# Patient Record
Sex: Female | Born: 2005 | Race: Black or African American | Hispanic: No | Marital: Single | Smoking: Never smoker
Health system: Southern US, Community
[De-identification: ages and names within clinical notes are randomized; demographics above are authoritative.]

## PROBLEM LIST (undated history)

## (undated) DIAGNOSIS — F32A Depression, unspecified: Secondary | ICD-10-CM

## (undated) DIAGNOSIS — G43909 Migraine, unspecified, not intractable, without status migrainosus: Secondary | ICD-10-CM

## (undated) DIAGNOSIS — J45909 Unspecified asthma, uncomplicated: Secondary | ICD-10-CM

## (undated) HISTORY — DX: Depression, unspecified: F32.A

## (undated) HISTORY — DX: Migraine, unspecified, not intractable, without status migrainosus: G43.909

---
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
## 2024-01-10 ENCOUNTER — Emergency Department (HOSPITAL_BASED_OUTPATIENT_CLINIC_OR_DEPARTMENT_OTHER)
Admission: EM | Admit: 2024-01-10 | Discharge: 2024-01-10 | Disposition: A | Discharge status: Home / Self Care | Payer: MEDICAID | Attending: Emergency Medicine | Admitting: Emergency Medicine

## 2024-01-10 ENCOUNTER — Emergency Department (HOSPITAL_BASED_OUTPATIENT_CLINIC_OR_DEPARTMENT_OTHER): Payer: MEDICAID

## 2024-01-10 ENCOUNTER — Other Ambulatory Visit: Payer: Self-pay

## 2024-01-10 DIAGNOSIS — Y92410 Unspecified street and highway as the place of occurrence of the external cause: Secondary | ICD-10-CM | POA: Insufficient documentation

## 2024-01-10 DIAGNOSIS — M545 Low back pain, unspecified: Secondary | ICD-10-CM | POA: Insufficient documentation

## 2024-01-10 DIAGNOSIS — R519 Headache, unspecified: Secondary | ICD-10-CM | POA: Diagnosis present

## 2024-01-10 DIAGNOSIS — G44319 Acute post-traumatic headache, not intractable: Secondary | ICD-10-CM

## 2024-01-10 DIAGNOSIS — S0083XA Contusion of other part of head, initial encounter: Secondary | ICD-10-CM | POA: Diagnosis not present

## 2024-01-10 LAB — PREGNANCY, URINE: Preg Test, Ur: NEGATIVE

## 2024-01-10 MED ORDER — ACETAMINOPHEN 500 MG PO TABS
1000.0000 mg | ORAL_TABLET | Freq: Once | ORAL | Status: AC
  Administered 2024-01-10: 1000 mg via ORAL
  Filled 2024-01-10: qty 2

## 2024-01-10 MED ORDER — KETOROLAC TROMETHAMINE 60 MG/2ML IM SOLN
30.0000 mg | Freq: Once | INTRAMUSCULAR | Status: AC
  Administered 2024-01-10: 30 mg via INTRAMUSCULAR
  Filled 2024-01-10: qty 2

## 2024-01-10 NOTE — Discharge Instructions (Signed)
 Recommend Tylenol and ibuprofen for pain control.  Your CT imaging was overall reassuring as was your physical exam.
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
## 2024-01-12 ENCOUNTER — Encounter: Payer: Self-pay | Admitting: Family Medicine

## 2024-01-12 ENCOUNTER — Ambulatory Visit (INDEPENDENT_AMBULATORY_CARE_PROVIDER_SITE_OTHER): Payer: MEDICAID | Admitting: Family Medicine

## 2024-01-12 VITALS — BP 98/68 | HR 107 | Temp 98.0°F | Ht 64.0 in | Wt 147.8 lb

## 2024-01-12 DIAGNOSIS — M546 Pain in thoracic spine: Secondary | ICD-10-CM | POA: Diagnosis not present

## 2024-01-12 DIAGNOSIS — F419 Anxiety disorder, unspecified: Secondary | ICD-10-CM | POA: Diagnosis not present

## 2024-01-12 DIAGNOSIS — F4310 Post-traumatic stress disorder, unspecified: Secondary | ICD-10-CM

## 2024-01-12 DIAGNOSIS — F321 Major depressive disorder, single episode, moderate: Secondary | ICD-10-CM

## 2024-01-12 DIAGNOSIS — S060X1D Concussion with loss of consciousness of 30 minutes or less, subsequent encounter: Secondary | ICD-10-CM

## 2024-01-12 MED ORDER — CYCLOBENZAPRINE HCL 5 MG PO TABS: 5.0000 mg | ORAL_TABLET | Freq: Every evening | ORAL | 0 refills | Status: DC | PRN

## 2024-01-12 MED ORDER — MELOXICAM 7.5 MG PO TABS: 7.5000 mg | ORAL_TABLET | Freq: Every day | ORAL | 0 refills | Status: DC

## 2024-01-12 MED ORDER — NORTRIPTYLINE HCL 10 MG PO CAPS: 10.0000 mg | ORAL_CAPSULE | Freq: Every day | ORAL | 1 refills | Status: DC

## 2024-01-12 NOTE — Patient Instructions (Addendum)
 A prescription for Nortriptyline was sent to your pharmacy. This is for anxiety and depression symptoms.  Similar to the other medicine you tried if you notice any increase in anxiety, depression, or thoughts of harming yourself stop the medication and let us know.  This is not the intended effect but it can happen when any of the medicines.  A prescription for Flexeril and Mobic were also sent to your pharmacy.  This is a muscle relaxer and a pain medication for your recent back injury.  Take Mobic with food and not on an empty stomach.  You can also use topical medicine such as Biofreeze, IcyHot, or Aspercreme.  You can also use a heating pad.  You would likely be sore for the next 4-6 weeks.
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
## 2024-03-31 ENCOUNTER — Other Ambulatory Visit: Payer: Self-pay | Admitting: Family Medicine

## 2024-03-31 DIAGNOSIS — F321 Major depressive disorder, single episode, moderate: Secondary | ICD-10-CM

## 2024-03-31 DIAGNOSIS — F419 Anxiety disorder, unspecified: Secondary | ICD-10-CM

## 2024-04-07 ENCOUNTER — Encounter: Payer: Self-pay | Admitting: Family Medicine

## 2024-04-07 ENCOUNTER — Ambulatory Visit (INDEPENDENT_AMBULATORY_CARE_PROVIDER_SITE_OTHER): Payer: MEDICAID | Admitting: Family Medicine

## 2024-04-07 VITALS — BP 98/72 | HR 84 | Temp 98.8°F | Ht 64.0 in | Wt 154.2 lb

## 2024-04-07 DIAGNOSIS — F321 Major depressive disorder, single episode, moderate: Secondary | ICD-10-CM | POA: Diagnosis not present

## 2024-04-07 DIAGNOSIS — F419 Anxiety disorder, unspecified: Secondary | ICD-10-CM

## 2024-04-07 DIAGNOSIS — T300 Burn of unspecified body region, unspecified degree: Secondary | ICD-10-CM | POA: Diagnosis not present

## 2024-04-07 DIAGNOSIS — F4310 Post-traumatic stress disorder, unspecified: Secondary | ICD-10-CM | POA: Diagnosis not present
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
## 2024-05-09 ENCOUNTER — Telehealth: Payer: MEDICAID | Admitting: Emergency Medicine

## 2024-05-09 DIAGNOSIS — R1110 Vomiting, unspecified: Secondary | ICD-10-CM | POA: Diagnosis not present

## 2024-05-09 MED ORDER — ONDANSETRON HCL 4 MG PO TABS: 4.0000 mg | ORAL_TABLET | Freq: Three times a day (TID) | ORAL | 0 refills | Status: DC | PRN

## 2024-05-09 NOTE — Progress Notes (Signed)
E-Visit for Vomiting  We are sorry that you are not feeling well. Here is how we plan to help!  Based on what you have shared with me it looks like you have a Virus that is irritating your GI tract.  Vomiting is the forceful emptying of a portion of the stomach's content through the mouth.  Although nausea and vomiting can make you feel miserable, it's important to remember that these are not diseases, but rather symptoms of an underlying illness.  When we treat short term symptoms, we always caution that any symptoms that persist should be fully evaluated in a medical office.  I have prescribed a medication that will help alleviate your symptoms and allow you to stay hydrated:  Zofran 4 mg 1 tablet every 8 hours as needed for nausea and vomiting  HOME CARE: Drink clear liquids.  This is very important! Dehydration (the lack of fluid) can lead to a serious complication.  Start off with 1 tablespoon every 5 minutes for 8 hours. You may begin eating bland foods after 8 hours without vomiting.  Start with saltine crackers, white bread, rice, mashed potatoes, applesauce. After 48 hours on a bland diet, you may resume a normal diet. Try to go to sleep.  Sleep often empties the stomach and relieves the need to vomit.  GET HELP RIGHT AWAY IF:  Your symptoms do not improve or worsen within 2 days after treatment. You have a fever for over 3 days. You cannot keep down fluids after trying the medication.  MAKE SURE YOU:  Understand these instructions. Will watch your condition. Will get help right away if you are not doing well or get worse.   Thank you for choosing an e-visit.  Your e-visit answers were reviewed by a board certified advanced clinical practitioner to complete your personal care plan. Depending upon the condition, your plan could have included both over the counter or prescription medications.  Please review your pharmacy choice. Make sure the pharmacy is open so you can pick  up prescription now. If there is a problem, you may contact your provider through [employer] and have the prescription routed to another pharmacy.  Your safety is important to [place]. If you have drug allergies check your prescription carefully.   For the next 24 hours you can use MyChart to ask questions about today's visit, request a non-urgent call back, or ask for a work or school excuse. You will get an email in the next two days asking about your experience. I hope that your e-visit has been valuable and will speed your recovery.   I have spent 5 minutes in review of e-visit questionnaire, review and updating patient chart, medical decision making and response to patient.   Willeen Cass, PhD, FNP-BC

## 2024-06-03 ENCOUNTER — Telehealth: Payer: MEDICAID | Admitting: Physician Assistant

## 2024-06-03 DIAGNOSIS — R112 Nausea with vomiting, unspecified: Secondary | ICD-10-CM

## 2024-06-03 MED ORDER — ONDANSETRON 4 MG PO TBDP: 4.0000 mg | ORAL_TABLET | Freq: Three times a day (TID) | ORAL | 0 refills | Status: AC | PRN

## 2024-06-03 NOTE — Progress Notes (Signed)
 E-Visit for Vomiting  We are sorry that you are not feeling well. Here is how we plan to help!   Vomiting is the forceful emptying of a portion of the stomach's content through the mouth.  Although nausea and vomiting can make you feel miserable, it's important to remember that these are not diseases, but rather symptoms of an underlying illness.  When we treat short term symptoms, we always caution that any symptoms that persist should be fully evaluated in a medical office.  I have prescribed a medication that will help alleviate your symptoms and allow you to stay hydrated:  Zofran 4 mg 1 tablet every 8 hours as needed for nausea and vomiting  HOME CARE: Drink clear liquids.  This is very important! Dehydration (the lack of fluid) can lead to a serious complication.  Start off with 1 tablespoon every 5 minutes for 8 hours. You may begin eating bland foods after 8 hours without vomiting.  Start with saltine crackers, white bread, rice, mashed potatoes, applesauce. After 48 hours on a bland diet, you may resume a normal diet. Try to go to sleep.  Sleep often empties the stomach and relieves the need to vomit.  GET HELP RIGHT AWAY IF:  Your symptoms do not improve or worsen within 2 days after treatment. You have a fever for over 3 days. You cannot keep down fluids after trying the medication.  MAKE SURE YOU:  Understand these instructions. Will watch your condition. Will get help right away if you are not doing well or get worse.   Thank you for choosing an e-visit.  Your e-visit answers were reviewed by a board certified advanced clinical practitioner to complete your personal care plan. Depending upon the condition, your plan could have included both over the counter or prescription medications.  Please review your pharmacy choice. Make sure the pharmacy is open so you can pick up prescription now. If there is a problem, you may contact your provider through [employer] and  have the prescription routed to another pharmacy.  Your safety is important to [place]. If you have drug allergies check your prescription carefully.   For the next 24 hours you can use MyChart to ask questions about today's visit, request a non-urgent call back, or ask for a work or school excuse. You will get an email in the next two days asking about your experience. I hope that your e-visit has been valuable and will speed your recovery.

## 2024-06-03 NOTE — Progress Notes (Signed)
 I have spent 5 minutes in review of e-visit questionnaire, review and updating patient chart, medical decision making and response to patient.   Piedad Climes, PA-C

## 2024-07-22 ENCOUNTER — Ambulatory Visit: Payer: MEDICAID | Admitting: Adult Health

## 2024-07-26 ENCOUNTER — Ambulatory Visit (INDEPENDENT_AMBULATORY_CARE_PROVIDER_SITE_OTHER): Payer: MEDICAID | Admitting: Family Medicine

## 2024-07-26 ENCOUNTER — Encounter: Payer: Self-pay | Admitting: Family Medicine

## 2024-07-26 VITALS — BP 112/84 | HR 80 | Temp 98.5°F | Ht 64.01 in | Wt 162.6 lb

## 2024-07-26 DIAGNOSIS — Z3201 Encounter for pregnancy test, result positive: Secondary | ICD-10-CM | POA: Diagnosis not present

## 2024-07-26 DIAGNOSIS — N921 Excessive and frequent menstruation with irregular cycle: Secondary | ICD-10-CM | POA: Diagnosis not present

## 2024-07-26 LAB — POCT URINE PREGNANCY: Preg Test, Ur: POSITIVE — AB

## 2024-07-26 NOTE — Progress Notes (Signed)
 Established Patient Office Visit   Subjective  Patient ID: Jane Briggs, female    DOB: 07-08-2006  Age: 18 y.o. MRN: [id number]  Chief Complaint  Patient presents with   Acute Visit    Patient came in today for a irregular menses, started 3 weeks ago, just stop 3 days ago, patient stated she had a lot of cramping and pain, bleeding was normal     Pt is an 18 yo female seen for ongoing concern.  Pt with irregular menses.  Started like normal cycle, then continued to have bleeding and spotting x 2 more wks.  Bleeding stopped 3 days ago.  Pt had unprotected sex in July.  Notes regular cycle in July, but unsure of exact date.  Pt denies fatigue, changes in meds, n/v, breast tenderness.      There are no active problems to display for this patient.  Past Medical History:  Diagnosis Date   Asthma    Depression    Migraine    History reviewed. No pertinent surgical history. Social History   Tobacco Use   Smoking status: Never   Smokeless tobacco: Never  Vaping Use   Vaping status: Never Used  Substance Use Topics   Alcohol use: No   Drug use: Not Currently   Family History  Problem Relation Age of Onset   Depression Mother    Mental illness Mother    Alcohol abuse Father    Depression Father    Drug abuse Father    Mental illness Father    Cancer Maternal Grandmother    Depression Maternal Grandmother    High blood pressure Maternal Grandmother    Allergies  Allergen Reactions   Shellfish Allergy    Zoloft  [Sertraline  Hcl] Other (See Comments)    Worsening depression symptoms.    ROS Negative unless stated above    Objective:     BP 112/84 (BP Location: Left Arm, Patient Position: Sitting, Cuff Size: Normal)   Pulse 80   Temp 98.5 F (36.9 C) (Oral)   Ht 5' 4.01 (1.626 m)   Wt 162 lb 9.6 oz (73.8 kg)   LMP 07/01/2024 (Exact Date)   SpO2 99%   BMI 27.90 kg/m  BP Readings from Last 3 Encounters:  07/26/24 112/84  04/07/24 98/72   01/12/24 98/68   Wt Readings from Last 3 Encounters:  07/26/24 162 lb 9.6 oz (73.8 kg) (90%, Z= 1.27)*  04/07/24 154 lb 3.2 oz (69.9 kg) (86%, Z= 1.07)*  01/12/24 147 lb 12.8 oz (67 kg) (82%, Z= 0.91)*   * Growth percentiles are based on CDC (Girls, 2-20 Years) data.      Physical Exam Constitutional:      Appearance: Normal appearance.  HENT:     Head: Normocephalic and atraumatic.     Nose: Nose normal.     Mouth/Throat:     Mouth: Mucous membranes are moist.  Cardiovascular:     Rate and Rhythm: Normal rate.  Pulmonary:     Effort: Pulmonary effort is normal.  Skin:    General: Skin is warm and dry.  Neurological:     Mental Status: She is alert and oriented to person, place, and time.        07/26/2024    4:53 PM 04/07/2024    2:42 PM 01/12/2024    4:54 PM  Depression screen PHQ 2/9  Decreased Interest 0 0 1  Down, Depressed, Hopeless 1 0 2  PHQ - 2 Score 1 0  3  Altered sleeping 0 1 3  Tired, decreased energy 1 1 2   Change in appetite 0 0 2  Feeling bad or failure about yourself  0 0 1  Trouble concentrating 0 0 1  Moving slowly or fidgety/restless 0 0 0  Suicidal thoughts 0 0 0  PHQ-9 Score 2 2 12   Difficult doing work/chores Somewhat difficult Not difficult at all       07/26/2024    4:53 PM 04/07/2024    2:42 PM 01/12/2024    4:55 PM 12/10/2023    1:13 PM  GAD 7 : Generalized Anxiety Score  Nervous, Anxious, on Edge 0 0 2 2  Control/stop worrying 0 0 2 1  Worry too much - different things 0 0 3 2  Trouble relaxing 1 1 3 2   Restless 0 0 2 0  Easily annoyed or irritable 0 0 1 1  Afraid - awful might happen 0 0 1 2  Total GAD 7 Score 1 1 14 10   Anxiety Difficulty Not difficult at all Not difficult at all Somewhat difficult Very difficult     Results for orders placed or performed in visit on 07/26/24  POCT urine pregnancy  Result Value Ref Range   Preg Test, Ur Positive (A) Negative      Assessment & Plan:   Positive urine pregnancy  test  Menorrhagia with irregular cycle -     TSH; Future -     T4, free; Future -     CBC with Differential/Platelet; Future -     hCG, quantitative, pregnancy; Future -     POCT urine pregnancy  Irregular menses.  POC UhCG positive.  Obtain serum quant hCG.  F/u scheduled for 08/05/24 at 4pm.  Start PNV.  Return in about 10 days (around 08/05/2024).   Clotilda JONELLE Single, MD

## 2024-07-27 LAB — CBC WITH DIFFERENTIAL/PLATELET
Absolute Lymphocytes: 2356 {cells}/uL (ref 1200–5200)
Absolute Monocytes: 765 {cells}/uL (ref 200–900)
Basophils Absolute: 17 {cells}/uL (ref 0–200)
Basophils Relative: 0.2 %
Eosinophils Absolute: 172 {cells}/uL (ref 15–500)
Eosinophils Relative: 2 %
HCT: 33.5 % — ABNORMAL LOW (ref 34.0–46.0)
Hemoglobin: 11.1 g/dL — ABNORMAL LOW (ref 11.5–15.3)
MCH: 29.4 pg (ref 25.0–35.0)
MCHC: 33.1 g/dL (ref 31.0–36.0)
MCV: 88.9 fL (ref 78.0–98.0)
MPV: 11.2 fL (ref 7.5–12.5)
Monocytes Relative: 8.9 %
Neutro Abs: 5289 {cells}/uL (ref 1800–8000)
Neutrophils Relative %: 61.5 %
Platelets: 318 Thousand/uL (ref 140–400)
RBC: 3.77 Million/uL — ABNORMAL LOW (ref 3.80–5.10)
RDW: 13.2 % (ref 11.0–15.0)
Total Lymphocyte: 27.4 %
WBC: 8.6 Thousand/uL (ref 4.5–13.0)

## 2024-07-27 LAB — HCG, QUANTITATIVE, PREGNANCY: HCG, Total, QN: 1351 m[IU]/mL

## 2024-07-27 LAB — T4, FREE: Free T4: 1.5 ng/dL — ABNORMAL HIGH (ref 0.8–1.4)

## 2024-07-27 LAB — TSH: TSH: 1.06 m[IU]/L

## 2024-07-28 ENCOUNTER — Ambulatory Visit: Payer: Self-pay | Admitting: Family Medicine

## 2024-07-28 DIAGNOSIS — O0281 Inappropriate change in quantitative human chorionic gonadotropin (hCG) in early pregnancy: Secondary | ICD-10-CM

## 2024-07-29 ENCOUNTER — Other Ambulatory Visit: Payer: Self-pay

## 2024-07-29 DIAGNOSIS — Z3201 Encounter for pregnancy test, result positive: Secondary | ICD-10-CM

## 2024-07-29 DIAGNOSIS — O0281 Inappropriate change in quantitative human chorionic gonadotropin (hCG) in early pregnancy: Secondary | ICD-10-CM

## 2024-07-29 DIAGNOSIS — N921 Excessive and frequent menstruation with irregular cycle: Secondary | ICD-10-CM

## 2024-07-30 ENCOUNTER — Ambulatory Visit (HOSPITAL_COMMUNITY)
Admission: RE | Admit: 2024-07-30 | Discharge: 2024-07-30 | Disposition: A | Discharge status: Home / Self Care | Payer: MEDICAID | Source: Ambulatory Visit | Attending: Family Medicine | Admitting: Family Medicine

## 2024-07-30 ENCOUNTER — Telehealth: Payer: Self-pay

## 2024-07-30 DIAGNOSIS — N921 Excessive and frequent menstruation with irregular cycle: Secondary | ICD-10-CM | POA: Diagnosis present

## 2024-07-30 DIAGNOSIS — Z3201 Encounter for pregnancy test, result positive: Secondary | ICD-10-CM | POA: Insufficient documentation

## 2024-07-30 DIAGNOSIS — O0281 Inappropriate change in quantitative human chorionic gonadotropin (hCG) in early pregnancy: Secondary | ICD-10-CM | POA: Diagnosis present

## 2024-07-30 NOTE — Telephone Encounter (Signed)
 I recieved call report from [place] with  imaging and she reported the US  from today results as  No intrauterine pregnancy, considerations include early pregnancy, or complete miscarriage, or unseen ectopic pregnancy.  [place] stated completed report will be sent shortly

## 2024-08-05 ENCOUNTER — Ambulatory Visit (INDEPENDENT_AMBULATORY_CARE_PROVIDER_SITE_OTHER): Payer: MEDICAID | Admitting: Family Medicine

## 2024-08-05 ENCOUNTER — Encounter: Payer: Self-pay | Admitting: Family Medicine

## 2024-08-05 VITALS — BP 110/74 | HR 91 | Temp 98.3°F | Ht 64.01 in | Wt 161.4 lb

## 2024-08-05 DIAGNOSIS — O0281 Inappropriate change in quantitative human chorionic gonadotropin (hCG) in early pregnancy: Secondary | ICD-10-CM | POA: Diagnosis not present

## 2024-08-05 DIAGNOSIS — R1030 Lower abdominal pain, unspecified: Secondary | ICD-10-CM | POA: Diagnosis not present

## 2024-08-05 DIAGNOSIS — Z3A09 9 weeks gestation of pregnancy: Secondary | ICD-10-CM

## 2024-08-05 DIAGNOSIS — N921 Excessive and frequent menstruation with irregular cycle: Secondary | ICD-10-CM | POA: Diagnosis not present

## 2024-08-05 NOTE — Progress Notes (Signed)
 Established Patient Office Visit   Subjective  Patient ID: Jane Briggs, female    DOB: 10-06-2006  Age: 18 y.o. MRN: [id number]  Chief Complaint  Patient presents with   Medical Management of Chronic Issues    Follow-up, patient is having sharp lower abdominal pains started a week ago     Pt is an 17 yo female seen for f/u.  Pt last seen on 9/22 for irregular menses with spotting.  LMP 05/31/24.  UhCG was positive but quant hCG was lower than expected for dates.  On U/S no IUP seen.  Pt having sharp lower abd pains x 1 wk.  Unsure if menses was starting.  Denies bleeding, fever, chills, n/v, dizziness.       There are no active problems to display for this patient.  Past Medical History:  Diagnosis Date   Asthma    Depression    Migraine    History reviewed. No pertinent surgical history. Social History   Tobacco Use   Smoking status: Never   Smokeless tobacco: Never  Vaping Use   Vaping status: Never Used  Substance Use Topics   Alcohol use: No   Drug use: Not Currently   Family History  Problem Relation Age of Onset   Depression Mother    Mental illness Mother    Alcohol abuse Father    Depression Father    Drug abuse Father    Mental illness Father    Cancer Maternal Grandmother    Depression Maternal Grandmother    High blood pressure Maternal Grandmother    Allergies  Allergen Reactions   Shellfish Allergy    Zoloft  [Sertraline  Hcl] Other (See Comments)    Worsening depression symptoms.    ROS Negative unless stated above    Objective:     BP 110/74 (BP Location: Left Arm, Patient Position: Sitting, Cuff Size: Normal)   Pulse 91   Temp 98.3 F (36.8 C) (Oral)   Ht 5' 4.01 (1.626 m)   Wt 161 lb 6.4 oz (73.2 kg)   LMP 05/31/2024 (Approximate)   SpO2 (!) 7%   BMI 27.70 kg/m  BP Readings from Last 3 Encounters:  08/05/24 110/74  07/26/24 112/84  04/07/24 98/72   Wt Readings from Last 3 Encounters:  08/05/24 161 lb 6.4  oz (73.2 kg) (89%, Z= 1.24)*  07/26/24 162 lb 9.6 oz (73.8 kg) (90%, Z= 1.27)*  04/07/24 154 lb 3.2 oz (69.9 kg) (86%, Z= 1.07)*   * Growth percentiles are based on CDC (Girls, 2-20 Years) data.      Physical Exam Constitutional:      General: She is not in acute distress.    Appearance: Normal appearance.  HENT:     Head: Normocephalic and atraumatic.     Nose: Nose normal.     Mouth/Throat:     Mouth: Mucous membranes are moist.  Cardiovascular:     Rate and Rhythm: Normal rate and regular rhythm.     Heart sounds: Normal heart sounds. No murmur heard.    No gallop.  Pulmonary:     Effort: Pulmonary effort is normal. No respiratory distress.     Breath sounds: Normal breath sounds. No wheezing, rhonchi or rales.  Abdominal:     Palpations: Abdomen is soft.  Skin:    General: Skin is warm and dry.  Neurological:     Mental Status: She is alert and oriented to person, place, and time.  07/26/2024    4:53 PM 04/07/2024    2:42 PM 01/12/2024    4:54 PM  Depression screen PHQ 2/9  Decreased Interest 0 0 1  Down, Depressed, Hopeless 1 0 2  PHQ - 2 Score 1 0 3  Altered sleeping 0 1 3  Tired, decreased energy 1 1 2   Change in appetite 0 0 2  Feeling bad or failure about yourself  0 0 1  Trouble concentrating 0 0 1  Moving slowly or fidgety/restless 0 0 0  Suicidal thoughts 0 0 0  PHQ-9 Score 2 2 12   Difficult doing work/chores Somewhat difficult Not difficult at all       07/26/2024    4:53 PM 04/07/2024    2:42 PM 01/12/2024    4:55 PM 12/10/2023    1:13 PM  GAD 7 : Generalized Anxiety Score  Nervous, Anxious, on Edge 0 0 2 2  Control/stop worrying 0 0 2 1  Worry too much - different things 0 0 3 2  Trouble relaxing 1 1 3 2   Restless 0 0 2 0  Easily annoyed or irritable 0 0 1 1  Afraid - awful might happen 0 0 1 2  Total GAD 7 Score 1 1 14 10   Anxiety Difficulty Not difficult at all Not difficult at all Somewhat difficult Very difficult     No results  found for any visits on 08/05/24.    Assessment & Plan:   Inappropriate level of quantitative hCG for gestational age in early pregnancy -     hCG, quantitative, pregnancy; Future -     Ambulatory referral to Obstetrics / Gynecology  Menorrhagia with irregular cycle -     hCG, quantitative, pregnancy; Future -     Ambulatory referral to Obstetrics / Gynecology  Lower abdominal pain   OB u/s less than 14 wks with OB transvaginal 07/30/24: no intrauterine pregnancy.  In the setting of positive beta HCG ddx includes early pregnancy, complete miscarriage, or an unseen ectopic pregnancy.  L adnexa likely within the L ovary a thick walled structure 1.3 x 0.9 x 1 cm favored to represent a developing corpus luteum.  Small to moderate vol simple appearing free fluid in the pelvis.     Pt stable.  Repeat quant hCG obtained this visit.  Given concern for early pregnancy, unseen ectopic, or complete miscarriage stat referral to OB/Gyn placed.  Pt given strict ED precautions.     No follow-ups on file.   Clotilda JONELLE Single, MD

## 2024-08-06 LAB — HCG, QUANTITATIVE, PREGNANCY: HCG, Total, QN: 2453 m[IU]/mL

## 2024-08-08 ENCOUNTER — Inpatient Hospital Stay (HOSPITAL_COMMUNITY): Payer: MEDICAID

## 2024-08-08 ENCOUNTER — Inpatient Hospital Stay (HOSPITAL_COMMUNITY)
Admission: AD | Admit: 2024-08-08 | Discharge: 2024-08-08 | Disposition: A | Discharge status: Home / Self Care | Payer: MEDICAID | Attending: Family Medicine | Admitting: Family Medicine

## 2024-08-08 DIAGNOSIS — O0000 Abdominal pregnancy without intrauterine pregnancy: Secondary | ICD-10-CM | POA: Diagnosis not present

## 2024-08-08 DIAGNOSIS — O209 Hemorrhage in early pregnancy, unspecified: Secondary | ICD-10-CM | POA: Diagnosis not present

## 2024-08-08 DIAGNOSIS — Z3A00 Weeks of gestation of pregnancy not specified: Secondary | ICD-10-CM

## 2024-08-08 DIAGNOSIS — R109 Unspecified abdominal pain: Secondary | ICD-10-CM | POA: Diagnosis present

## 2024-08-08 LAB — CBC WITH DIFFERENTIAL/PLATELET
Abs Immature Granulocytes: 0.03 K/uL (ref 0.00–0.07)
Basophils Absolute: 0 K/uL (ref 0.0–0.1)
Basophils Relative: 1 %
Eosinophils Absolute: 0.5 K/uL (ref 0.0–0.5)
Eosinophils Relative: 6 %
HCT: 37 % (ref 36.0–46.0)
Hemoglobin: 12.1 g/dL (ref 12.0–15.0)
Immature Granulocytes: 0 %
Lymphocytes Relative: 21 %
Lymphs Abs: 1.7 K/uL (ref 0.7–4.0)
MCH: 30.1 pg (ref 26.0–34.0)
MCHC: 32.7 g/dL (ref 30.0–36.0)
MCV: 92 fL (ref 80.0–100.0)
Monocytes Absolute: 0.9 K/uL (ref 0.1–1.0)
Monocytes Relative: 12 %
Neutro Abs: 4.9 K/uL (ref 1.7–7.7)
Neutrophils Relative %: 60 %
Platelets: 234 K/uL (ref 150–400)
RBC: 4.02 MIL/uL (ref 3.87–5.11)
RDW: 13.3 % (ref 11.5–15.5)
WBC: 8 K/uL (ref 4.0–10.5)
nRBC: 0 % (ref 0.0–0.2)

## 2024-08-08 LAB — COMPREHENSIVE METABOLIC PANEL WITH GFR
ALT: 13 U/L (ref 0–44)
AST: 19 U/L (ref 15–41)
Albumin: 4 g/dL (ref 3.5–5.0)
Alkaline Phosphatase: 76 U/L (ref 38–126)
Anion gap: 16 — ABNORMAL HIGH (ref 5–15)
BUN: 8 mg/dL (ref 6–20)
CO2: 19 mmol/L — ABNORMAL LOW (ref 22–32)
Calcium: 9.6 mg/dL (ref 8.9–10.3)
Chloride: 104 mmol/L (ref 98–111)
Creatinine, Ser: 0.74 mg/dL (ref 0.44–1.00)
GFR, Estimated: >60 mL/min (ref >60)
Glucose, Bld: 82 mg/dL (ref 70–99)
Potassium: 3.7 mmol/L (ref 3.5–5.1)
Sodium: 139 mmol/L (ref 135–145)
Total Bilirubin: 0.5 mg/dL (ref 0.0–1.2)
Total Protein: 8.2 g/dL — ABNORMAL HIGH (ref 6.5–8.1)

## 2024-08-08 LAB — ABO/RH: ABO/RH(D): A POS

## 2024-08-08 LAB — HCG, QUANTITATIVE, PREGNANCY: hCG, Beta Chain, Quant, S: 3245 m[IU]/mL — ABNORMAL HIGH (ref <5)

## 2024-08-08 MED ORDER — METHOTREXATE FOR ECTOPIC PREGNANCY
50.0000 mg/m2 | Freq: Once | INTRAMUSCULAR | Status: AC
  Administered 2024-08-08: 90 mg via INTRAMUSCULAR
  Filled 2024-08-08: qty 3.6

## 2024-08-08 NOTE — MAU Provider Note (Addendum)
 History     CSN: [id number]  Arrival date and time: 08/08/24 1754   None     Chief Complaint  Patient presents with   Follow-up   HPI Ms. Teegan Guinther Boyce-Flowers is a 18 y.o. year old G1P0 female at Unknown weeks gestation who presents to MAU reporting on-going intermittent, lower abdominal pain since she found out about her pregnancy; rated 6/10. She describes the VB as weird VB in August. She also complains of upper back pain that started hurting about 1 week ago; rated 7/10. She has not taken any medications for the pain. She was seen by her PCP on 07/26/2024 with a HCG 1,351 and on 08/05/2024 with a HCG of 2,453. She had an U/S on 07/30/2024 with no IUP seen; pregnancy of unknown location.    OB History     Gravida  1   Para      Term      Preterm      AB      Living         SAB      IAB      Ectopic      Multiple      Live Births              Past Medical History:  Diagnosis Date   Asthma    Depression    Migraine     No past surgical history on file.  Family History  Problem Relation Age of Onset   Depression Mother    Mental illness Mother    Alcohol abuse Father    Depression Father    Drug abuse Father    Mental illness Father    Cancer Maternal Grandmother    Depression Maternal Grandmother    High blood pressure Maternal Grandmother     Social History   Tobacco Use   Smoking status: Never   Smokeless tobacco: Never  Vaping Use   Vaping status: Never Used  Substance Use Topics   Alcohol use: No   Drug use: Not Currently    Allergies:  Allergies  Allergen Reactions   Shellfish Allergy    Zoloft  [Sertraline  Hcl] Other (See Comments)    Worsening depression symptoms.    Medications Prior to Admission  Medication Sig Dispense Refill Last Dose/Taking   cetirizine (ZYRTEC) 10 MG tablet Take by mouth.   08/07/2024   fluticasone (FLONASE) 50 MCG/ACT nasal spray 1 spray Once Daily.   08/07/2024   EPINEPHrine  0.3 mg/0.3  mL IJ SOAJ injection Inject 0.3 mg into the muscle as needed for anaphylaxis. 2 each 2    ondansetron  (ZOFRAN -ODT) 4 MG disintegrating tablet Take 1 tablet (4 mg total) by mouth every 8 (eight) hours as needed for nausea or vomiting. 20 tablet 0     Review of Systems Physical Exam   Blood pressure (!) 103/53, pulse 86, temperature 98.9 F (37.2 C), temperature source Oral, resp. rate 15, height 5' 4 (1.626 m), weight 73.7 kg, last menstrual period 05/31/2024, SpO2 100%.  Physical Exam  MAU Course  Procedures  MDM HCG OB TVUS   US  OB Transvaginal Result Date: 08/08/2024 CLINICAL DATA:  First trimester vaginal bleeding, pelvic and back pain. Beta hCG is pending at this time. EXAM: OBSTETRIC <14 WK US  AND TRANSVAGINAL OB US  TECHNIQUE: Both transabdominal and transvaginal ultrasound examinations were performed for complete evaluation of the gestation as well as the maternal uterus, adnexal regions, and pelvic cul-de-sac. Transvaginal technique was performed to assess  early pregnancy. COMPARISON:  Study of 07/30/2024. No intrauterine pregnancy at that time. Mild-to-moderate anechoic free fluid. FINDINGS: Intrauterine gestational sac: None. Yolk sac:  None. Embryo:  Not visualized. Cardiac Activity: N/a. MSD: None. CRL: None. Subchorionic hemorrhage:  N/a. Maternal uterus/adnexae: The uterus is anteverted measuring 7.5 x 3.1 x 4.6 cm with well-circumscribed endometrial complex measuring 7.2 mm. The cervix is closed. No uterine wall mass is seen. There previously was a corpus luteum of the left ovary which has resolved. The left ovary is unremarkable measuring 9.27 cm3, with preservation of this color flow. On the right, on image 20/33 of the first series, there is a heterogeneous ovoid mass to the right of the uterus measuring 1.6 x 1.2 x 1.4 cm with brisk color flow, separate from the right ovary and suspicious for an ectopic pregnancy. This was not seen on the prior transvaginal scan. Right ovary  is just lateral to this and is enlarged by 2 adjacent hemorrhagic cysts measuring 2.4 cm and 2.3 cm. In total right ovary is 20.37 cm3 with preservation of color flow. There is a mild amount of free cul-de-sac fluid and right adnexal fluid but the fluid is anechoic, and was seen previously and has not appreciably increased. No left adnexal mass is seen. IMPRESSION: 1. New demonstration of ovoid 1.6 x 1.3 x 1.4 cm right adnexal solid mass with brisk color flow separate from the ovary to the right of the uterus. Findings suspicious for ectopic pregnancy. 2. Mild anechoic free fluid, with no appreciable increase since 07/30/2024. Obstetric consult recommended. 3. Right ovary enlarged by adjacent hemorrhagic cysts. No evidence of torsion or suspicious mass. 4. Interval resolved left ovarian corpus luteum. 5. Critical Value/emergent results were called by telephone at the time of interpretation on 08/08/2024 at 8:25 pm to provider DR. Shailen Thielen, who verbally acknowledged these results. Electronically Signed   By: Francis Quam M.D.   On: 08/08/2024 20:37    Report given to and care assumed by Dr. Ilean @ [address], CNM 08/08/2024, 8:32 PM  Assessment and Plan  Gerlean Cid Boyce-Flowers is an 18 yo G1 presenting for vaginal bleeding in early pregnancy and concern for ectopic pregnancy.  Ectopic pregnancy Patient presenting for abnormal abdominal pain over the last week.  Also has had irregular vaginal bleeding.  hCG quant's performed starting 9/22 which were 1351.  Repeat on 10/2 was 2453.  Ultrasound on 9/26 showing no IUP.  Repeat hCG today 3245.  Ultrasound showing new ovoid 1.6 x 1.3 x 1.4 cm right adnexal mass with brisk color flow suspicious for ectopic pregnancy.  Discussed results with patient.  Discussed methotrexate treatment.  The risks of methotrexate were reviewed including failure requiring repeat dosing or eventual surgery. She understands that methotrexate involves frequent  return visits to monitor lab values and that she remains at risk of ectopic rupture until her beta is less than assay. The patient opts to proceed with methotrexate. She has no history of hepatic or renal dysfunction, has normal BUN/Cr/LFT's/platelets. She is felt to be reliable for follow-up. Side effects of photosensitivity & GI upset were discussed. She knows to avoid direct sunlight and abstain from alcohol, aspirin and aspirin-like products for two weeks. She was counseled to discontinue any MVI with folic acid. She understands to follow up on D4 (Wednesday) and D7 (Saturday) for repeat BHCG and was given the instruction sheet. Strict ectopic precautions were reviewed, the patient knows to call with any abdominal pain, vomiting, fainting, or any concerns  with her health.

## 2024-08-08 NOTE — Discharge Instructions (Signed)
 The risks of methotrexate were reviewed including failure requiring repeat dosing or eventual surgery. You expressed understanding that methotrexate involves frequent return visits to monitor lab values and that you remain at risk of ectopic rupture until your pregnancy hormone level returns to normal. You have opted to proceed with methotrexate.  We discussed side effects of photosensitivity & GI upset.  You should avoid direct sunlight and abstain from alcohol, NSAIDs and sexual intercourse for two weeks. If you are taking any multivitamins with folic acid you should discontinue them.  ?You should follow up on day 4 (Wednesday at [facility] for women on [address]) and day 7 (Saturday here in the maternity assessment unit) for repeat pregnancy hormone level checks.  ?Call or return to the MAU with any abdominal pain, vomiting, fainting, or fever.

## 2024-08-08 NOTE — MAU Note (Signed)
 Jane Briggs is a 18 y.o. at Unknown here in MAU reporting: ongoing lower abdominal pain since she found out she was pregnant. Had some weird bleeding in the month of August.  Feeling lower abdominal cramping that comes and goes. States her upper back is starting to hurt, it started 1 week ago. Denies taking any medication for the pain. Denies bleeding currently. Patient denies any unusual vaginal discharge, vaginal odor, itching or pain. Denies any recent sexual intercourse.   LMP: 05/11/24, unsure Onset of complaint: ongoing  Pain score: abd 6 upper back 7  Vitals:   08/08/24 1852  BP: (!) 103/53  Pulse: 86  Resp: 15  Temp: 98.9 F (37.2 C)  SpO2: 100%     FHT:n/a Lab orders placed from triage:

## 2024-08-09 ENCOUNTER — Telehealth: Payer: Self-pay | Admitting: Family Medicine

## 2024-08-09 NOTE — Telephone Encounter (Signed)
-----   Message from Norleen LULLA Rover sent at 08/08/2024 10:53 PM EDT ----- Regarding: hCG check s/p methotrexate Hey,  This patient was seen in the MAU on Sunday and diagnosed with an ectopic pregnancy.  She was given methotrexate and needs a lab visit on Wednesday 10/8 for repeat hCG level.  Could we please get her scheduled for this lab visit.  Thanks!  Dr.

## 2024-08-09 NOTE — Telephone Encounter (Signed)
 Called patient and left a message for patient to come in the office on Wednesday morning. Informed her if she would rather come in the afternoon to call the office.

## 2024-08-11 ENCOUNTER — Other Ambulatory Visit: Payer: Self-pay

## 2024-08-11 ENCOUNTER — Other Ambulatory Visit: Payer: MEDICAID | Admitting: *Deleted

## 2024-08-11 VITALS — BP 105/55 | HR 74 | Ht 64.0 in | Wt 159.6 lb

## 2024-08-11 DIAGNOSIS — O0000 Abdominal pregnancy without intrauterine pregnancy: Secondary | ICD-10-CM

## 2024-08-11 LAB — BETA HCG QUANT (REF LAB): hCG Quant: 1632 m[IU]/mL

## 2024-08-11 NOTE — Progress Notes (Signed)
 Here for stat bhcg day 4 after methotrexate for ectopic pregnancy. She denies pain or bleeding  but states she just has a little nausea and feels weak or tired. I explained we will draw stat bhcg and call her with results and plan of care later today. She voices understanding. Rock Skip PEAK 3:00 results received of 1632 and reviewed results/ history with Dr. Eldonna who advised good response to methotrexate and follow plan of care for stat bhcg on day 7 at MAU on 08/14/24. I called patient and informed her of results and recommendation and importance of getting stat bhcg 08/14/24.  She voices understanding. Rock Skip PEAK

## 2024-08-14 ENCOUNTER — Other Ambulatory Visit: Payer: Self-pay

## 2024-08-14 ENCOUNTER — Ambulatory Visit (HOSPITAL_COMMUNITY)
Admit: 2024-08-14 | Discharge: 2024-08-14 | Disposition: A | Discharge status: Home / Self Care | Payer: MEDICAID | Attending: Obstetrics & Gynecology | Admitting: Obstetrics & Gynecology

## 2024-08-14 ENCOUNTER — Inpatient Hospital Stay (HOSPITAL_COMMUNITY)
Admission: AD | Admit: 2024-08-14 | Discharge: 2024-08-14 | Disposition: A | Discharge status: Home / Self Care | Payer: MEDICAID | Attending: Obstetrics & Gynecology | Admitting: Obstetrics & Gynecology

## 2024-08-14 DIAGNOSIS — Z09 Encounter for follow-up examination after completed treatment for conditions other than malignant neoplasm: Secondary | ICD-10-CM | POA: Diagnosis not present

## 2024-08-14 DIAGNOSIS — Z008 Encounter for other general examination: Secondary | ICD-10-CM | POA: Insufficient documentation

## 2024-08-14 DIAGNOSIS — R7989 Other specified abnormal findings of blood chemistry: Secondary | ICD-10-CM

## 2024-08-14 LAB — HCG, QUANTITATIVE, PREGNANCY: hCG, Beta Chain, Quant, S: 1358 m[IU]/mL — ABNORMAL HIGH (ref <5)

## 2024-08-14 NOTE — MAU Provider Note (Signed)
  History   Chief Complaint:  Follow-up   Jane Briggs is  18 y.o. G1P0 Patient's last menstrual period was 05/31/2024 (approximate).. Patient is here for follow up of quantitative HCG and ongoing surveillance of pregnancy status. She is Unknown weeks gestation  by LMP.     Since her last visit, the patient is without new complaint. The patient reports bleeding as  none now.  She denies any pain.   General ROS:  negative   Her previous Quantitative HCG values are: 09/22: 1351 10/02: 2453 10/05: 3245 MTX 10/08: 1632  Physical Exam   Blood pressure (!) 112/55, pulse 60, temperature 98.2 F (36.8 C), temperature source Oral, resp. rate 16, height 5' 4 (1.626 m), weight 73.3 kg, last menstrual period 05/31/2024, SpO2 98%.   Physical Exam Vitals and nursing note reviewed.  Constitutional:      General: She is not in acute distress.    Appearance: She is well-developed.  HENT:     Head: Normocephalic.  Eyes:     Pupils: Pupils are equal, round, and reactive to light.  Cardiovascular:     Rate and Rhythm: Normal rate and regular rhythm.  Pulmonary:     Effort: Pulmonary effort is normal. No respiratory distress.     Breath sounds: Normal breath sounds.  Abdominal:     Palpations: Abdomen is soft.     Tenderness: There is no abdominal tenderness.  Musculoskeletal:        General: Normal range of motion.     Cervical back: Normal range of motion.  Skin:    General: Skin is warm and dry.  Neurological:     Mental Status: She is alert and oriented to person, place, and time.  Psychiatric:        Behavior: Behavior normal.        Thought Content: Thought content normal.        Judgment: Judgment normal.       Labs: Results for orders placed or performed during the hospital encounter of 08/14/24 (from the past 24 hours)  hCG, quantitative, pregnancy   Collection Time: 08/14/24 11:32 AM  Result Value Ref Range   hCG, Beta Chain, Quant, S 1,358 (H) <5  mIU/mL    Assessment:   1. Follow-up exam   2. Elevated serum hCG     -Reviewed results with Dr. Eveline- ok to discharge home with weekly follow up HCG.  -Patient agreeable with plan of care. Message sent to office to schedule weekly HCG.  Plan: -Discharge home in stable condition -Vaginal bleeding and pain precautions discussed -Patient advised to follow-up with OB weekly, message sent to office. -Patient may return to MAU as needed or if her condition were to change or worsen  Aleck CHRISTELLA Fireman, CNM 08/14/2024, 12:10 PM

## 2024-08-14 NOTE — MAU Note (Signed)
 Jane Briggs is a 18 y.o. at Unknown here in MAU reporting: repeat bhcg day 7 after methotrexate. States she had light vaginal bleeding on her underwear this am. Has stopped. Denies any pain.  hCG results: 10/2: 2453 10/5: 3245  Day methotrexate was given  10/8: 1632 10/11: pending   Pain score: 0 Vitals:   08/14/24 1149  BP: (!) 112/55  Pulse: 60  Resp: 16  Temp: 98.2 F (36.8 C)  SpO2: 98%      Lab orders placed from triage:  bhcg

## 2024-08-16 ENCOUNTER — Other Ambulatory Visit: Payer: Self-pay

## 2024-08-16 DIAGNOSIS — O0000 Abdominal pregnancy without intrauterine pregnancy: Secondary | ICD-10-CM

## 2024-08-17 ENCOUNTER — Ambulatory Visit: Payer: Self-pay | Admitting: Family Medicine

## 2024-08-19 ENCOUNTER — Other Ambulatory Visit: Payer: Self-pay

## 2024-08-19 ENCOUNTER — Other Ambulatory Visit: Payer: MEDICAID

## 2024-08-20 ENCOUNTER — Telehealth: Payer: Self-pay | Admitting: Family Medicine

## 2024-08-20 ENCOUNTER — Other Ambulatory Visit: Payer: MEDICAID

## 2024-08-20 DIAGNOSIS — O209 Hemorrhage in early pregnancy, unspecified: Secondary | ICD-10-CM

## 2024-08-20 NOTE — Telephone Encounter (Signed)
Pt call and stated she is returning your call and want you to call her back. 

## 2024-08-21 LAB — BETA HCG QUANT (REF LAB): hCG Quant: 324 m[IU]/mL

## 2024-08-23 ENCOUNTER — Ambulatory Visit: Payer: Self-pay | Admitting: Family Medicine

## 2024-08-23 NOTE — Telephone Encounter (Signed)
 See result note.

## 2024-08-25 ENCOUNTER — Other Ambulatory Visit: Payer: Self-pay | Admitting: *Deleted

## 2024-08-25 DIAGNOSIS — O0000 Abdominal pregnancy without intrauterine pregnancy: Secondary | ICD-10-CM

## 2024-08-26 ENCOUNTER — Other Ambulatory Visit: Payer: MEDICAID

## 2024-08-26 ENCOUNTER — Other Ambulatory Visit: Payer: Self-pay

## 2024-08-26 DIAGNOSIS — O0000 Abdominal pregnancy without intrauterine pregnancy: Secondary | ICD-10-CM

## 2024-08-27 LAB — BETA HCG QUANT (REF LAB): hCG Quant: 114 m[IU]/mL

## 2024-09-02 ENCOUNTER — Other Ambulatory Visit: Payer: Self-pay

## 2024-09-02 ENCOUNTER — Other Ambulatory Visit: Payer: MEDICAID

## 2024-09-02 ENCOUNTER — Encounter: Payer: Self-pay | Admitting: Family Medicine

## 2024-09-02 DIAGNOSIS — R7989 Other specified abnormal findings of blood chemistry: Secondary | ICD-10-CM

## 2024-09-03 ENCOUNTER — Ambulatory Visit: Payer: Self-pay | Admitting: Family Medicine

## 2024-09-03 DIAGNOSIS — O039 Complete or unspecified spontaneous abortion without complication: Secondary | ICD-10-CM

## 2024-09-03 LAB — BETA HCG QUANT (REF LAB): hCG Quant: 91 m[IU]/mL

## 2024-09-06 NOTE — Telephone Encounter (Addendum)
 Called patient regarding recent lab hcg results. Patient scheduled for lab appointment on Thursday, 09/09/24 at 1:55 PM. Patient confirmed scheduled appointment and denies any other needs at this time.   Rosaline, RN  ----- Message from Donnice CHRISTELLA Carolus sent at 09/03/2024 12:14 PM EDT ----- Downtrending appropriately Clinical staff please coordinate follow up hcg in one week, trend to 0 ----- Message ----- From: Interface, [facility] Lab Results In Sent: 09/03/2024   4:36 AM EDT To: Donnice CHRISTELLA Carolus, MD

## 2024-09-09 ENCOUNTER — Other Ambulatory Visit: Payer: MEDICAID

## 2024-09-09 ENCOUNTER — Other Ambulatory Visit: Payer: Self-pay

## 2024-09-09 DIAGNOSIS — O039 Complete or unspecified spontaneous abortion without complication: Secondary | ICD-10-CM

## 2024-09-10 ENCOUNTER — Ambulatory Visit: Payer: Self-pay | Admitting: Family Medicine

## 2024-09-10 DIAGNOSIS — O039 Complete or unspecified spontaneous abortion without complication: Secondary | ICD-10-CM

## 2024-09-10 LAB — BETA HCG QUANT (REF LAB): hCG Quant: 68 m[IU]/mL

## 2024-09-13 NOTE — Telephone Encounter (Addendum)
 Called pt to advise her of hcg level from 09/09/24 and provider's recommendation of recheck in one week.  Pt agreeable to lab visit 09/16/24 at 3:35pm.  She had no questions at this time.    Waddell, RN   ----- Message from Donnice CHRISTELLA Carolus sent at 09/10/2024  8:40 AM EST ----- Downtrending appropriately s/p methotrexate Clinical staff please coordinate follow up hcg in one week, trend to 0 ----- Message ----- From: Interface, [facility] Lab Results In Sent: 09/10/2024   4:36 AM EST To: Donnice CHRISTELLA Carolus, MD

## 2024-09-16 ENCOUNTER — Other Ambulatory Visit: Payer: MEDICAID

## 2024-10-16 ENCOUNTER — Emergency Department (HOSPITAL_BASED_OUTPATIENT_CLINIC_OR_DEPARTMENT_OTHER)
Admission: EM | Admit: 2024-10-16 | Discharge: 2024-10-16 | Disposition: A | Discharge status: Home / Self Care | Payer: MEDICAID | Attending: Emergency Medicine | Admitting: Emergency Medicine

## 2024-10-16 ENCOUNTER — Other Ambulatory Visit: Payer: Self-pay

## 2024-10-16 ENCOUNTER — Encounter (HOSPITAL_BASED_OUTPATIENT_CLINIC_OR_DEPARTMENT_OTHER): Payer: Self-pay | Admitting: Emergency Medicine

## 2024-10-16 DIAGNOSIS — J111 Influenza due to unidentified influenza virus with other respiratory manifestations: Secondary | ICD-10-CM

## 2024-10-16 DIAGNOSIS — E876 Hypokalemia: Secondary | ICD-10-CM | POA: Insufficient documentation

## 2024-10-16 DIAGNOSIS — J101 Influenza due to other identified influenza virus with other respiratory manifestations: Secondary | ICD-10-CM | POA: Insufficient documentation

## 2024-10-16 DIAGNOSIS — R1030 Lower abdominal pain, unspecified: Secondary | ICD-10-CM | POA: Insufficient documentation

## 2024-10-16 LAB — PREGNANCY, URINE: Preg Test, Ur: NEGATIVE

## 2024-10-16 LAB — COMPREHENSIVE METABOLIC PANEL WITH GFR
ALT: 14 U/L (ref 0–44)
AST: 16 U/L (ref 15–41)
Albumin: 4.5 g/dL (ref 3.5–5.0)
Alkaline Phosphatase: 84 U/L (ref 38–126)
Anion gap: 16 — ABNORMAL HIGH (ref 5–15)
BUN: 7 mg/dL (ref 6–20)
CO2: 19 mmol/L — ABNORMAL LOW (ref 22–32)
Calcium: 9.8 mg/dL (ref 8.9–10.3)
Chloride: 103 mmol/L (ref 98–111)
Creatinine, Ser: 0.63 mg/dL (ref 0.44–1.00)
GFR, Estimated: >60 mL/min (ref >60)
Glucose, Bld: 100 mg/dL — ABNORMAL HIGH (ref 70–99)
Potassium: 3.3 mmol/L — ABNORMAL LOW (ref 3.5–5.1)
Sodium: 138 mmol/L (ref 135–145)
Total Bilirubin: 0.9 mg/dL (ref 0.0–1.2)
Total Protein: 8.1 g/dL (ref 6.5–8.1)

## 2024-10-16 LAB — CBC WITH DIFFERENTIAL/PLATELET
Abs Immature Granulocytes: 0.02 K/uL (ref 0.00–0.07)
Basophils Absolute: 0 K/uL (ref 0.0–0.1)
Basophils Relative: 0 %
Eosinophils Absolute: 0.1 K/uL (ref 0.0–0.5)
Eosinophils Relative: 1 %
HCT: 34.4 % — ABNORMAL LOW (ref 36.0–46.0)
Hemoglobin: 11.9 g/dL — ABNORMAL LOW (ref 12.0–15.0)
Immature Granulocytes: 0 %
Lymphocytes Relative: 10 %
Lymphs Abs: 0.8 K/uL (ref 0.7–4.0)
MCH: 30 pg (ref 26.0–34.0)
MCHC: 34.6 g/dL (ref 30.0–36.0)
MCV: 86.6 fL (ref 80.0–100.0)
Monocytes Absolute: 0.4 K/uL (ref 0.1–1.0)
Monocytes Relative: 5 %
Neutro Abs: 6.8 K/uL (ref 1.7–7.7)
Neutrophils Relative %: 84 %
Platelets: 245 K/uL (ref 150–400)
RBC: 3.97 MIL/uL (ref 3.87–5.11)
RDW: 12.3 % (ref 11.5–15.5)
WBC: 8.1 K/uL (ref 4.0–10.5)
nRBC: 0 % (ref 0.0–0.2)

## 2024-10-16 LAB — RESP PANEL BY RT-PCR (RSV, FLU A&B, COVID)  RVPGX2
Influenza A by PCR: POSITIVE — AB
Influenza B by PCR: NEGATIVE
Resp Syncytial Virus by PCR: NEGATIVE
SARS Coronavirus 2 by RT PCR: NEGATIVE

## 2024-10-16 LAB — URINALYSIS, ROUTINE W REFLEX MICROSCOPIC
Glucose, UA: NEGATIVE mg/dL
Ketones, ur: >=80 mg/dL — AB
Nitrite: NEGATIVE
Protein, ur: 100 mg/dL — AB
Specific Gravity, Urine: 1.02 (ref 1.005–1.030)
pH: 7 (ref 5.0–8.0)

## 2024-10-16 LAB — MAGNESIUM: Magnesium: 1.7 mg/dL (ref 1.7–2.4)

## 2024-10-16 LAB — GROUP A STREP BY PCR: Group A Strep by PCR: NOT DETECTED

## 2024-10-16 LAB — LIPASE, BLOOD: Lipase: 19 U/L (ref 11–51)

## 2024-10-16 LAB — URINALYSIS, MICROSCOPIC (REFLEX)

## 2024-10-16 LAB — CK: Total CK: 56 U/L (ref 38–234)

## 2024-10-16 MED ORDER — KETOROLAC TROMETHAMINE 30 MG/ML IJ SOLN
30.0000 mg | Freq: Once | INTRAMUSCULAR | Status: AC
  Administered 2024-10-16: 30 mg via INTRAVENOUS
  Filled 2024-10-16: qty 1

## 2024-10-16 MED ORDER — ACETAMINOPHEN 325 MG PO TABS
650.0000 mg | ORAL_TABLET | Freq: Once | ORAL | Status: AC
  Administered 2024-10-16: 650 mg via ORAL
  Filled 2024-10-16: qty 2

## 2024-10-16 MED ORDER — POTASSIUM CHLORIDE CRYS ER 10 MEQ PO TBCR: 10.0000 meq | EXTENDED_RELEASE_TABLET | Freq: Every day | ORAL | 0 refills | Status: AC

## 2024-10-16 MED ORDER — ONDANSETRON HCL 4 MG/2ML IJ SOLN
4.0000 mg | Freq: Once | INTRAMUSCULAR | Status: AC
  Administered 2024-10-16: 4 mg via INTRAVENOUS
  Filled 2024-10-16: qty 2

## 2024-10-16 MED ORDER — SODIUM CHLORIDE 0.9 % IV BOLUS
1000.0000 mL | Freq: Once | INTRAVENOUS | Status: AC
  Administered 2024-10-16: 1000 mL via INTRAVENOUS

## 2024-10-16 MED ORDER — ONDANSETRON 4 MG PO TBDP: 4.0000 mg | ORAL_TABLET | Freq: Three times a day (TID) | ORAL | 0 refills | Status: AC | PRN

## 2024-10-16 NOTE — ED Notes (Signed)
 Pt aware of necessity of urine sample.

## 2024-10-16 NOTE — ED Notes (Signed)
 Patient transferred from waiting room to ED treatment room. Assuming pt care at this time.

## 2024-10-16 NOTE — ED Provider Notes (Signed)
 [place] [facility] Provider Note   CSN: [id number] Arrival date & time: 10/16/24  1739     Patient presents with: Generalized Body Aches and Emesis   Jane Briggs is a 18 y.o. female presenting to ED with 24 hours of headache, body aches, nausea, vomiting, muscle aches.  No sick contacts in the house.  She has no other medical underlying issues.  She has cramping lower abdominal pain   HPI     Prior to Admission medications  Medication Sig Start Date End Date Taking? Authorizing Provider  ondansetron  (ZOFRAN -ODT) 4 MG disintegrating tablet Take 1 tablet (4 mg total) by mouth every 8 (eight) hours as needed for up to 12 doses for nausea or vomiting. 10/16/24  Yes Schyler Counsell, Donnice PARAS, MD  potassium chloride  (KLOR-CON  M) 10 MEQ tablet Take 1 tablet (10 mEq total) by mouth daily. 10/16/24 10/26/24 Yes Jody Aguinaga, Donnice PARAS, MD  cetirizine (ZYRTEC) 10 MG tablet Take by mouth. 09/26/18   [provider]  EPINEPHrine  0.3 mg/0.3 mL IJ SOAJ injection Inject 0.3 mg into the muscle as needed for anaphylaxis. Patient not taking: Reported on 08/11/2024 12/10/23   Mercer Clotilda SAUNDERS, MD  fluticasone [facility]) 50 MCG/ACT nasal spray 1 spray Once Daily. Patient not taking: Reported on 08/11/2024 09/26/18   [provider]  ondansetron  (ZOFRAN -ODT) 4 MG disintegrating tablet Take 1 tablet (4 mg total) by mouth every 8 (eight) hours as needed for nausea or vomiting. 06/03/24   Gladis Elsie BROCKS, PA-C    Allergies: Shellfish allergy and Zoloft  [sertraline  hcl]    Review of Systems  Updated Vital Signs BP 116/80 (BP Location: Left Arm)   Pulse 90   Temp 98.8 F (37.1 C) (Oral)   Resp 18   Ht 5' 4 (1.626 m)   Wt 72.6 kg   LMP 05/31/2024 (Approximate)   SpO2 99%   Breastfeeding No   BMI 27.46 kg/m   Physical Exam Constitutional:      General: She is not in acute distress. HENT:     Head: Normocephalic and atraumatic.  Eyes:      Conjunctiva/sclera: Conjunctivae normal.     Pupils: Pupils are equal, round, and reactive to light.  Cardiovascular:     Rate and Rhythm: Regular rhythm. Tachycardia present.  Pulmonary:     Effort: Pulmonary effort is normal. No respiratory distress.  Abdominal:     General: There is no distension.     Tenderness: There is abdominal tenderness.  Skin:    General: Skin is warm and dry.  Neurological:     General: No focal deficit present.     Mental Status: She is alert. Mental status is at baseline.  Psychiatric:        Mood and Affect: Mood normal.        Behavior: Behavior normal.     (all labs ordered are listed, but only abnormal results are displayed) Labs Reviewed  RESP PANEL BY RT-PCR (RSV, FLU A&B, COVID)  RVPGX2 - Abnormal; Notable for the following components:      Result Value   Influenza A by PCR POSITIVE (*)    All other components within normal limits  COMPREHENSIVE METABOLIC PANEL WITH GFR - Abnormal; Notable for the following components:   Potassium 3.3 (*)    CO2 19 (*)    Glucose, Bld 100 (*)    Anion gap 16 (*)    All other components within normal limits  CBC WITH DIFFERENTIAL/PLATELET -  Abnormal; Notable for the following components:   Hemoglobin 11.9 (*)    HCT 34.4 (*)    All other components within normal limits  URINALYSIS, ROUTINE W REFLEX MICROSCOPIC - Abnormal; Notable for the following components:   Color, Urine AMBER (*)    APPearance HAZY (*)    Hgb urine dipstick LARGE (*)    Bilirubin Urine SMALL (*)    Ketones, ur >=80 (*)    Protein, ur 100 (*)    Leukocytes,Ua SMALL (*)    All other components within normal limits  URINALYSIS, MICROSCOPIC (REFLEX) - Abnormal; Notable for the following components:   Bacteria, UA MANY (*)    All other components within normal limits  GROUP A STREP BY PCR  LIPASE, BLOOD  PREGNANCY, URINE  MAGNESIUM  CK    EKG: None  Radiology: No results found.   Procedures   Medications Ordered  in the ED  acetaminophen  (TYLENOL ) tablet 650 mg (has no administration in time range)  sodium chloride  0.9 % bolus 1,000 mL (1,000 mLs Intravenous New Bag/Given 10/16/24 1841)  ketorolac  (TORADOL ) 30 MG/ML injection 30 mg (30 mg Intravenous Given 10/16/24 1840)  ondansetron  (ZOFRAN ) injection 4 mg (4 mg Intravenous Given 10/16/24 1840)                                    Medical Decision Making Amount and/or Complexity of Data Reviewed Labs: ordered.  Risk OTC drugs. Prescription drug management.   This patient presents to the ED with concern for constellation of symptoms as noted above. This involves an extensive number of treatment options, and is a complaint that carries with it a high risk of complications and morbidity.  The differential diagnosis includes viral illness most likely including COVID-19 or influenza versus a secondary infection versus intra-abdominal process versus other  I ordered and personally interpreted labs.  The pertinent results include: Mild hypokalemia, very minor anion gap elevation, concentrated urine.  Influenza test is positive   The patient was maintained on a cardiac monitor.  I personally viewed and interpreted the cardiac monitored which showed an underlying rhythm of: Sinus tachycardia  I ordered medication including IV fluids, Toradol  and Zofran  for hydration, nausea and abdominal pain  I have reviewed the patients home medicines and have made adjustments as needed  Test Considered: Lower suspicion for acute appendicitis, PID, pelvic emergency, abdominal surgical or infectious emergency.  I do not see indication for CT imaging of the abdomen.  I suspect her cramping abdominal pain is likely related to ongoing viral enteritis.  After the interventions noted above, I reevaluated the patient and found that they have: improved  No indication for antibiotics or Tamiflu.  Patient is hemodynamically stable for discharge.  No evidence of severe  dehydration or need for hospitalization.  She benefited from the IV fluids given given the concentration of her urine.  She will drink water at home.  Can use over-the-counter medications.  Zofran  prescribed as needed for nausea.  The patient and her family members at the bedside are all in agreement and comfortable going home   Dispostion:  After consideration of the diagnostic results and the patients response to treatment, I feel that the patent would benefit from outpatient follow-up      Final diagnoses:  Influenza  Hypokalemia    ED Discharge Orders          Ordered    ondansetron  (  ZOFRAN -ODT) 4 MG disintegrating tablet  Every 8 hours PRN        10/16/24 2007    potassium chloride  (KLOR-CON  M) 10 MEQ tablet  Daily        10/16/24 2007               Cottie Donnice PARAS, MD 10/16/24 2009

## 2024-10-16 NOTE — ED Notes (Signed)
 Patient denies any chance of pregnancy prior to medication administration.

## 2024-10-16 NOTE — ED Triage Notes (Signed)
 Pt c/o chills, body aches, vomiting since yesterday

## 2024-10-16 NOTE — Discharge Instructions (Addendum)
 You were diagnosed with influenza, or the flu.  This is a viral illness and can acute feel very sick.  It is important you stay hydrated drink plenty of water at home this week.  Most people are sick for 5 to 7 days, which can include fevers.  You also can be contagious for the first 7 days from the start of your symptoms.  Be aware of washing your hands, wearing a mask in public, and avoiding being around the elderly, immunocompromise people and babies.  At home you can take over-the-counter Tylenol  and ibuprofen , as needed, together as needed for headaches, body aches, fevers and chills.  Your potassium level is mildly low today.  I prescribed you a few days of supplement to make up for this.
